# Patient Record
Sex: Male | Born: 2005 | Race: White | Hispanic: Yes | Marital: Single | Smoking: Never smoker
Health system: Southern US, Community
[De-identification: ages and names within clinical notes are randomized; demographics above are authoritative.]

## PROBLEM LIST (undated history)

## (undated) DIAGNOSIS — E785 Hyperlipidemia, unspecified: Secondary | ICD-10-CM

## (undated) HISTORY — DX: Hyperlipidemia, unspecified: E78.5

## (undated) HISTORY — PX: TESTICULAR EXPLORATION: SHX5145

---
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
## 2023-09-09 ENCOUNTER — Ambulatory Visit: Payer: Self-pay | Admitting: Pediatrics

## 2023-09-09 NOTE — Patient Instructions (Incomplete)
  As your medical provider, it is important to me that you continue to receive high-quality primary care services as you transition to adulthood.  Below is a list of adult medicine practices that are currently accepting new patients.  Please reach out to one of these practices to schedule a new patient appointment as soon as possible.    Adult Primary Care Clinics Name [facility]  Address: [place], [place] [address]  Phone: [phone] Hours: Monday - Friday 9 AM -6 PM  Types of insurance accepted:  Marland Kitchen Pharmacist, community . [place] (orange card) . Medicaid . Medicare . Uninsured  Language services:  Marland Kitchen Video and phone interpreters available   Ages 32 and older    . Adult primary care . Onsite pharmacy . Integrated behavioral health . Financial assistance counseling . Walk-in hours for established patients  Financial assistance counseling hours: Tuesdays 2:00PM - 5:00PM  Thursday 8:30AM - 4:30PM  Space is limited, 10 on Tuesday and 20 on Thursday on a first come, first serve basis  Name [place]  Address: [address] [place], [place] [address]  Phone: [phone]  Hours: Monday - Friday 8:30 AM - 5 PM  Types of insurance accepted:  Marland Kitchen Pharmacist, community . Medicaid . Medicare . Uninsured  Language services:  Marland Kitchen Video and phone interpreters available   All ages - newborn to adult   . Primary care for all ages (children and adults) . Integrated behavioral health . Nutritionist . Financial assistance counseling   Name [place] on the ground floor of [facility]  Address: [address] [place],  [place]  [address]  Phone: [phone]  Hours: Monday - Friday 8:15 AM - 5 PM  Types of insurance accepted:  Marland Kitchen Sports coach . Medicaid . Medicare . Uninsured  Language services:  Marland Kitchen Video and phone interpreters available   Ages 106 and older   . Adult primary care . Nutritionist . Certified Diabetes Educator  . Integrated behavioral health . Financial assistance counseling   Name [facility]  Address: [address] [place], [place] [address]  Phone: [phone]  Hours: Monday - Friday 8:30 AM - 5 PM    Types of insurance accepted:  Marland Kitchen Pharmacist, community . Medicaid . Medicare . Uninsured  Language services:  Marland Kitchen Video and phone interpreters available   All ages - newborn to adult   . Primary care for all ages (children and adults) . Integrated behavioral health . Financial assistance counseling

## 2023-09-09 NOTE — Progress Notes (Deleted)
 Adolescent Well Care Visit Jared [name] is a 18 y.o. male who is here for well care.    PCP:  Kentrail Shew, [place], MD  Interpreter used: {IBHSMARTLISTINTERPRETERYESNO:29718::no}   History was provided by the {CHL AMB PERSONS; PED RELATIVES/OTHER W/PATIENT:[phone]}.  Confidentiality was discussed with the patient and, if applicable, with caregiver as well. Patient's personal or confidential phone number: ***  Current Issues:  ***.   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 BP appropriate today. No vision changes, headaches, swelling. *** fasting?***  Hypertriglyceridemia + mixed hyperlipidemia-previously followed by endocrinology, February 2023.  HgbA1c + fasting lipid panel today *** still trying to avoid fatty and fried foods  Congenital small testes  seen by Dr. Kathi at [facility] in Dec 2019 for [facility] orchipexy-- at that time L testicle about 40% smaller than one on the right.  Hormone studies (including testosterone  levels) were normal in Feb 2022 -- so L testicular atrophy likely has not affected his over all endocrinology.  Urology advised that he is completely fertile.    Last seen July 2022   Goiter  Last seen for well care May 2023  Previously with a lot of stress related to graduation, anticipated started, and future unknowns *** had been referred to behavioral health  Snoring - previously trialed on Flonase  (discontinued after one week).  Sleep study scheduled given prolonged duration, nighttime wakening, and obesity with elevated BP.  Sleep study was cancelled because of positive covid test, not rescheduled.  No longer with nighttime wakening, gasping or persistent snoring. 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 {yes***/no:17258} Stressors: {Stressors:30367::No}  Education: School Name and Grade: ***  Problems: {CHL AMB PED PROBLEMS AT SCHOOL:[phone]} Future Plans: ***  Menstruation:   Menstrual History: ***   Dental Patient has a dental home: {yes/no***:64::yes}  Confidential Social History:  yes, Smokes marijuana about two times per month (for about 2 years).  Vaping about two times per week (for about 2 years).  Obtains marijuana from friends.  No alcohol or other recreational drug use. *** Tobacco?  {YES/NO/WILD CARDS:18581} Cannabis? {YES/NO/WILD RJMID:81418} Alcohol? {YES/NO/WILD RJMID:81418}  Sexually Active?  {YES [id number]   Partner preference?  {CHL AMB PARTNER PREFERENCE:[phone]}  Pregnancy Prevention: ***  Screenings: The patient completed the Rapid Assessment for Adolescent Preventive Services screening questionnaire and the following topics were identified as risk factors and discussed: {CHL AMB ASSESSMENT TOPICS:21012045}   PHQ-9, modified for Adolescents  completed and results indicated ***  Physical Exam:  There were no vitals filed for this visit. There were no vitals taken for this visit. Body mass index: body mass index is unknown because there is no height or weight on file. Growth %ile SmartLinks can only be used for patients less than 101 years old.  No results found.  General Appearance:   {PE GENERAL APPEARANCE:22457}  HENT: Normocephalic, no obvious abnormality, conjunctiva clear  Mouth:   Normal appearing teeth,***  untreated dental caries,   Neck:   Supple; thyroid : no enlargement, symmetric, no  tenderness/mass/nodules  Chest ***  Lungs:   Clear to auscultation bilaterally, normal work of breathing  Heart:   Regular rate and rhythm, S1 and S2 normal, no murmurs;   Abdomen:   Soft, non-tender, no mass, or organomegaly  GU {adol gu exam:315266}  Musculoskeletal:   Tone and strength strong and symmetrical, all extremities               Lymphatic:   No cervical adenopathy  Skin/Hair/Nails:   Skin warm, dry and intact, no rashes, no bruises or petechiae  Skin-Acne:  ***  Neurologic:   Strength, gait, and coordination normal and age-appropriate     Assessment and Plan:   *** Growth: {Growth:29841::Appropriate growth for age}  BMI {ACTION; IS/IS WNU:78978602} appropriate for age  Concerns regarding school: {Yes/No:304960894::No}  Concerns regarding home: {Yes/No:304960894::No}  Hearing screening result:{normal/abnormal/not examined:14677} Vision screening result: {normal/abnormal/not examined:14677}  Counseling provided for {CHL AMB PED VACCINE COUNSELING:210130100} vaccine components No orders of the defined types were placed in this encounter.    No follow-ups on file.SABRA  [place] B [name], MD

## 2023-10-13 ENCOUNTER — Encounter (HOSPITAL_COMMUNITY): Payer: Self-pay

## 2023-10-13 ENCOUNTER — Emergency Department (HOSPITAL_COMMUNITY): Payer: Self-pay

## 2023-10-13 ENCOUNTER — Emergency Department (HOSPITAL_COMMUNITY)
Admission: EM | Admit: 2023-10-13 | Discharge: 2023-10-13 | Disposition: A | Discharge status: Home / Self Care | Payer: Self-pay | Source: Ambulatory Visit | Attending: Emergency Medicine | Admitting: Emergency Medicine

## 2023-10-13 ENCOUNTER — Other Ambulatory Visit: Payer: Self-pay

## 2023-10-13 ENCOUNTER — Ambulatory Visit (HOSPITAL_COMMUNITY)
Admission: EM | Admit: 2023-10-13 | Discharge: 2023-10-13 | Disposition: A | Discharge status: Home / Self Care | Payer: Self-pay | Attending: Internal Medicine | Admitting: Internal Medicine

## 2023-10-13 DIAGNOSIS — M545 Low back pain, unspecified: Secondary | ICD-10-CM | POA: Insufficient documentation

## 2023-10-13 DIAGNOSIS — M25552 Pain in left hip: Secondary | ICD-10-CM | POA: Insufficient documentation

## 2023-10-13 DIAGNOSIS — S299XXA Unspecified injury of thorax, initial encounter: Secondary | ICD-10-CM | POA: Insufficient documentation

## 2023-10-13 DIAGNOSIS — S0990XA Unspecified injury of head, initial encounter: Secondary | ICD-10-CM | POA: Insufficient documentation

## 2023-10-13 DIAGNOSIS — S301XXA Contusion of abdominal wall, initial encounter: Secondary | ICD-10-CM | POA: Insufficient documentation

## 2023-10-13 DIAGNOSIS — W19XXXA Unspecified fall, initial encounter: Secondary | ICD-10-CM

## 2023-10-13 DIAGNOSIS — W11XXXA Fall on and from ladder, initial encounter: Secondary | ICD-10-CM | POA: Insufficient documentation

## 2023-10-13 LAB — I-STAT CHEM 8, ED
BUN: 14 mg/dL (ref 6–20)
Calcium, Ion: 1.17 mmol/L (ref 1.15–1.40)
Chloride: 106 mmol/L (ref 98–111)
Creatinine, Ser: 0.8 mg/dL (ref 0.61–1.24)
Glucose, Bld: 117 mg/dL — ABNORMAL HIGH (ref 70–99)
HCT: 44 % (ref 39.0–52.0)
Hemoglobin: 15 g/dL (ref 13.0–17.0)
Potassium: 4.7 mmol/L (ref 3.5–5.1)
Sodium: 139 mmol/L (ref 135–145)
TCO2: 24 mmol/L (ref 22–32)

## 2023-10-13 LAB — CBC
HCT: 45 % (ref 39.0–52.0)
Hemoglobin: 14.6 g/dL (ref 13.0–17.0)
MCH: 29 pg (ref 26.0–34.0)
MCHC: 32.4 g/dL (ref 30.0–36.0)
MCV: 89.5 fL (ref 80.0–100.0)
Platelets: 381 K/uL (ref 150–400)
RBC: 5.03 MIL/uL (ref 4.22–5.81)
RDW: 12.3 % (ref 11.5–15.5)
WBC: 10.4 K/uL (ref 4.0–10.5)
nRBC: 0 % (ref 0.0–0.2)

## 2023-10-13 LAB — BASIC METABOLIC PANEL WITH GFR
Anion gap: 11 (ref 5–15)
BUN: 13 mg/dL (ref 6–20)
CO2: 21 mmol/L — ABNORMAL LOW (ref 22–32)
Calcium: 9.4 mg/dL (ref 8.9–10.3)
Chloride: 104 mmol/L (ref 98–111)
Creatinine, Ser: 0.8 mg/dL (ref 0.61–1.24)
GFR, Estimated: >60 mL/min (ref >60)
Glucose, Bld: 118 mg/dL — ABNORMAL HIGH (ref 70–99)
Potassium: 4.5 mmol/L (ref 3.5–5.1)
Sodium: 136 mmol/L (ref 135–145)

## 2023-10-13 MED ORDER — IOHEXOL 350 MG/ML SOLN
75.0000 mL | Freq: Once | INTRAVENOUS | Status: AC | PRN
  Administered 2023-10-13: 75 mL via INTRAVENOUS

## 2023-10-13 MED ORDER — KETOROLAC TROMETHAMINE 30 MG/ML IJ SOLN
30.0000 mg | Freq: Once | INTRAMUSCULAR | Status: AC
  Administered 2023-10-13: 30 mg via INTRAVENOUS
  Filled 2023-10-13: qty 1

## 2023-10-13 MED ORDER — MORPHINE SULFATE (PF) 2 MG/ML IV SOLN
4.0000 mg | Freq: Once | INTRAVENOUS | Status: AC
  Administered 2023-10-13: 4 mg via INTRAVENOUS
  Filled 2023-10-13: qty 2

## 2023-10-13 MED ORDER — FENTANYL CITRATE PF 50 MCG/ML IJ SOSY: 50.0000 ug | PREFILLED_SYRINGE | Freq: Once | INTRAMUSCULAR | Status: DC

## 2023-10-13 NOTE — ED Triage Notes (Signed)
 Pt states fell 3-48ft off a latter yesterday landing on lt side on the ground. C/o lt hip pain up to back with bruising noted. Took tylenol with no relief. States unable to bare weight.

## 2023-10-13 NOTE — ED Provider Notes (Signed)
 [place] EMERGENCY DEPARTMENT AT [facility] Provider Note   CSN: [id number] Arrival date & time: 10/13/23  1026     Patient presents with: Back Pain   Jared [name] is a 18 y.o. male.   Patient here after fall yesterday.  Pain to the left lower back and left hip after falling back from a ladder from about 4 feet.  He initially had a small amount of pain but worsened here over the last day.  He has noticed some bruising to the left side of his back and flank.  Denies any weakness.  Has not had any loss of bowel or bladder.  Not on any blood thinners.  Not having any extremity pain otherwise.  The history is provided by the patient.       Prior to Admission medications   Not on File    Allergies: Patient has no known allergies.    Review of Systems  Updated Vital Signs BP 130/77 (BP Location: Left Arm)   Pulse 79   Temp 98 F (36.7 C)   Resp 20   Ht 6' 4 (1.93 m)   Wt 104.3 kg   SpO2 98%   BMI 28.00 kg/m   Physical Exam Vitals and nursing note reviewed.  Constitutional:      General: He is not in acute distress.    Appearance: He is well-developed.  HENT:     Head: Normocephalic and atraumatic.  Eyes:     Conjunctiva/sclera: Conjunctivae normal.  Cardiovascular:     Rate and Rhythm: Normal rate and regular rhythm.     Heart sounds: No murmur heard. Pulmonary:     Effort: Pulmonary effort is normal. No respiratory distress.     Breath sounds: Normal breath sounds.  Abdominal:     Palpations: Abdomen is soft.     Tenderness: There is no abdominal tenderness.  Musculoskeletal:        General: No swelling.     Cervical back: Normal range of motion and neck supple. No tenderness.     Comments: No specific midline spinal tenderness, tenderness to the paraspinal lumbar and thoracic muscles bilaterally, tenderness to the left flank/left hip area, ecchymosis to the left flank  Skin:    General: Skin is warm and dry.     Capillary Refill:  Capillary refill takes less than 2 seconds.  Neurological:     General: No focal deficit present.     Mental Status: He is alert and oriented to person, place, and time.     Cranial Nerves: No cranial nerve deficit.     Sensory: No sensory deficit.     Motor: No weakness.     Coordination: Coordination normal.  Psychiatric:        Mood and Affect: Mood normal.     (all labs ordered are listed, but only abnormal results are displayed) Labs Reviewed  BASIC METABOLIC PANEL WITH GFR - Abnormal; Notable for the following components:      Result Value   CO2 21 (*)    Glucose, Bld 118 (*)    All other components within normal limits  I-STAT CHEM 8, ED - Abnormal; Notable for the following components:   Glucose, Bld 117 (*)    All other components within normal limits  CBC    EKG: None  Radiology: CT HEAD WO CONTRAST Result Date: 10/13/2023 CLINICAL DATA:  Provided history: Head trauma, moderate/severe. EXAM: CT HEAD WITHOUT CONTRAST TECHNIQUE: Contiguous axial images were obtained from  the base of the skull through the vertex without intravenous contrast. RADIATION DOSE REDUCTION: This exam was performed according to the departmental dose-optimization program which includes automated exposure control, adjustment of the mA and/or kV according to patient size and/or use of iterative reconstruction technique. COMPARISON:  Head CT 08/29/2008. FINDINGS: Brain: Cerebral volume is normal. There is no acute intracranial hemorrhage. No demarcated cortical infarct. No extra-axial fluid collection. No evidence of an intracranial mass. No midline shift. Vascular: No hyperdense vessel. Skull: No calvarial fracture or aggressive osseous lesion. Sinuses/Orbits: No mass or acute finding within the imaged orbits. No significant paranasal sinus disease. IMPRESSION: No evidence of an acute intracranial abnormality. Electronically Signed   By: Rockey Childs D.O.   On: 10/13/2023 13:55   CT CERVICAL SPINE WO  CONTRAST Result Date: 10/13/2023 CLINICAL DATA:  Fall.  Back pain. EXAM: CT CERVICAL, THORACIC, AND LUMBAR SPINE WITHOUT CONTRAST TECHNIQUE: Multidetector CT imaging of the cervical, thoracic and lumbar spine was performed without intravenous contrast. Multiplanar CT image reconstructions were also generated. RADIATION DOSE REDUCTION: This exam was performed according to the departmental dose-optimization program which includes automated exposure control, adjustment of the mA and/or kV according to patient size and/or use of iterative reconstruction technique. COMPARISON:  None Available. FINDINGS: CT CERVICAL SPINE FINDINGS Alignment: No acute subluxation. Skull base and vertebrae: No acute fracture. Soft tissues and spinal canal: No prevertebral fluid or swelling. No visible canal hematoma. Disc levels:  No acute findings.  No degenerative changes. Upper chest: Negative. CT THORACIC SPINE FINDINGS Alignment: No acute subluxation. Vertebrae: No acute fracture. Paraspinal and other soft tissues: Negative. Disc levels: No acute findings. No significant degenerative changes. CT LUMBAR SPINE FINDINGS Segmentation: 5 lumbar type vertebrae. Alignment: No acute subluxation. Vertebrae: No acute fracture. Paraspinal and other soft tissues: Partially visualized subcutaneous contusion of the left posterior lumbar region and a partially visualized hematoma. See report for the CT of the abdomen pelvis with Disc levels: No acute findings. No significant degenerative changes. IMPRESSION: 1. No acute/traumatic cervical, thoracic, or lumbar spine pathology. 2. Partially visualized subcutaneous contusion and hematoma of the left posterior lumbar region Electronically Signed   By: Vanetta Chou M.D.   On: 10/13/2023 13:40   CT L-SPINE NO CHARGE Result Date: 10/13/2023 CLINICAL DATA:  Fall.  Back pain. EXAM: CT CERVICAL, THORACIC, AND LUMBAR SPINE WITHOUT CONTRAST TECHNIQUE: Multidetector CT imaging of the cervical, thoracic and  lumbar spine was performed without intravenous contrast. Multiplanar CT image reconstructions were also generated. RADIATION DOSE REDUCTION: This exam was performed according to the departmental dose-optimization program which includes automated exposure control, adjustment of the mA and/or kV according to patient size and/or use of iterative reconstruction technique. COMPARISON:  None Available. FINDINGS: CT CERVICAL SPINE FINDINGS Alignment: No acute subluxation. Skull base and vertebrae: No acute fracture. Soft tissues and spinal canal: No prevertebral fluid or swelling. No visible canal hematoma. Disc levels:  No acute findings.  No degenerative changes. Upper chest: Negative. CT THORACIC SPINE FINDINGS Alignment: No acute subluxation. Vertebrae: No acute fracture. Paraspinal and other soft tissues: Negative. Disc levels: No acute findings. No significant degenerative changes. CT LUMBAR SPINE FINDINGS Segmentation: 5 lumbar type vertebrae. Alignment: No acute subluxation. Vertebrae: No acute fracture. Paraspinal and other soft tissues: Partially visualized subcutaneous contusion of the left posterior lumbar region and a partially visualized hematoma. See report for the CT of the abdomen pelvis with Disc levels: No acute findings. No significant degenerative changes. IMPRESSION: 1. No acute/traumatic cervical, thoracic, or lumbar  spine pathology. 2. Partially visualized subcutaneous contusion and hematoma of the left posterior lumbar region Electronically Signed   By: Vanetta Chou M.D.   On: 10/13/2023 13:40   CT T-SPINE NO CHARGE Result Date: 10/13/2023 CLINICAL DATA:  Fall.  Back pain. EXAM: CT CERVICAL, THORACIC, AND LUMBAR SPINE WITHOUT CONTRAST TECHNIQUE: Multidetector CT imaging of the cervical, thoracic and lumbar spine was performed without intravenous contrast. Multiplanar CT image reconstructions were also generated. RADIATION DOSE REDUCTION: This exam was performed according to the departmental  dose-optimization program which includes automated exposure control, adjustment of the mA and/or kV according to patient size and/or use of iterative reconstruction technique. COMPARISON:  None Available. FINDINGS: CT CERVICAL SPINE FINDINGS Alignment: No acute subluxation. Skull base and vertebrae: No acute fracture. Soft tissues and spinal canal: No prevertebral fluid or swelling. No visible canal hematoma. Disc levels:  No acute findings.  No degenerative changes. Upper chest: Negative. CT THORACIC SPINE FINDINGS Alignment: No acute subluxation. Vertebrae: No acute fracture. Paraspinal and other soft tissues: Negative. Disc levels: No acute findings. No significant degenerative changes. CT LUMBAR SPINE FINDINGS Segmentation: 5 lumbar type vertebrae. Alignment: No acute subluxation. Vertebrae: No acute fracture. Paraspinal and other soft tissues: Partially visualized subcutaneous contusion of the left posterior lumbar region and a partially visualized hematoma. See report for the CT of the abdomen pelvis with Disc levels: No acute findings. No significant degenerative changes. IMPRESSION: 1. No acute/traumatic cervical, thoracic, or lumbar spine pathology. 2. Partially visualized subcutaneous contusion and hematoma of the left posterior lumbar region Electronically Signed   By: Vanetta Chou M.D.   On: 10/13/2023 13:40   CT CHEST ABDOMEN PELVIS W CONTRAST Result Date: 10/13/2023 CLINICAL DATA:  Back and abdominal pain after fall yesterday. EXAM: CT CHEST, ABDOMEN, AND PELVIS WITH CONTRAST TECHNIQUE: Multidetector CT imaging of the chest, abdomen and pelvis was performed following the standard protocol during bolus administration of intravenous contrast. RADIATION DOSE REDUCTION: This exam was performed according to the departmental dose-optimization program which includes automated exposure control, adjustment of the mA and/or kV according to patient size and/or use of iterative reconstruction technique.  CONTRAST:  75mL OMNIPAQUE  IOHEXOL  350 MG/ML SOLN COMPARISON:  None Available. FINDINGS: CT CHEST FINDINGS Cardiovascular: No significant vascular findings. Normal heart size. No pericardial effusion. Mediastinum/Nodes: No enlarged mediastinal, hilar, or axillary lymph nodes. Thyroid  gland, trachea, and esophagus demonstrate no significant findings. Lungs/Pleura: Lungs are clear. No pleural effusion or pneumothorax. Musculoskeletal: No chest wall mass or suspicious bone lesions identified. CT ABDOMEN PELVIS FINDINGS Hepatobiliary: No focal liver abnormality is seen. No gallstones, gallbladder wall thickening, or biliary dilatation. Pancreas: Unremarkable. No pancreatic ductal dilatation or surrounding inflammatory changes. Spleen: Normal in size without focal abnormality. Adrenals/Urinary Tract: Adrenal glands are unremarkable. Kidneys are normal, without renal calculi, focal lesion, or hydronephrosis. Bladder is unremarkable. Stomach/Bowel: Stomach is within normal limits. Appendix appears normal. No evidence of bowel wall thickening, distention, or inflammatory changes. Vascular/Lymphatic: No significant vascular findings are present. No enlarged abdominal or pelvic lymph nodes. Reproductive: Prostate is unremarkable. Other: Large hematoma is seen in subcutaneous tissues of left posterior flank region which extends inferiorly to the level of the iliac crest. Musculoskeletal: No acute or significant osseous findings. IMPRESSION: 1. Large hematoma is seen in subcutaneous tissues of left posterior flank region which extends inferiorly to the level of the iliac crest. 2. No other traumatic injury seen in the chest, abdomen or pelvis. Electronically Signed   By: Lynwood Landy Raddle M.D.   On:  10/13/2023 13:39     Procedures   Medications Ordered in the ED  ketorolac  (TORADOL ) 30 MG/ML injection 30 mg (has no administration in time range)  morphine  (PF) 2 MG/ML injection 4 mg (4 mg Intravenous Given 10/13/23 1303)   iohexol  (OMNIPAQUE ) 350 MG/ML injection 75 mL (75 mLs Intravenous Contrast Given 10/13/23 1247)                                    Medical Decision Making Amount and/or Complexity of Data Reviewed Labs: ordered.  Risk Prescription drug management.   Jared [name] is here at left flank pain after he fell backwards off of a ladder yesterday from about 4 feet.  He has hematoma to his left flank.  Has been able to ambulate but with discomfort.  Has had CT imaging ordered while in triage with a CT of his head neck chest abdomen and pelvis and of his back.  He has been given morphine  with great improvement.  Lab work is unremarkable.  I reviewed interpreted labs.  He has got normal vitals.  No fever.  He is not on any anticoagulation.  Looks like he has a soft tissue hematoma on exam.  CT scans were done and read by radiologist with no acute findings other than edema and contusion to the left flank muscles.  Overall suspect contusion of the soft tissues but there is no fracture of the spine hip or any other processes going on.  Gave him a dose of Toradol  here in the ED.  Recommend Tylenol ibuprofen ice and rest and suspect that he will heal well.  Understands return precautions.  Patient discharged.  This chart was dictated using voice recognition software.  Despite best efforts to proofread,  errors can occur which can change the documentation meaning.      Final diagnoses:  Hematoma of flank    ED Discharge Orders     None          Ruthe Cornet, DO 10/13/23 1416

## 2023-10-13 NOTE — ED Provider Notes (Signed)
 [facility]    CSN: [id number] Arrival date & time: 10/13/23  9156      History   Chief Complaint Chief Complaint  Patient presents with   Fall   HPI Mychael Smock is a 18 y.o. male presenting to urgent care endorsing left hip and lower back pain.  He fell off a ladder, roughly 3-4 feet, landing on his left buttock yesterday.  He reports acute onset of pain in the left lateral hip and lower back.  He was able to walk initially, went home and took a shower, but says that his pain gradually worsened overnight.  Today he is unable to bear weight because of pain.  He tried taking Tylenol for pain relief, which was mildly effective.  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   Essential hypertension, benign 09/22/2017   Acanthosis 09/22/2017   Dyspepsia 09/22/2017   Goiter 09/22/2017    Past Surgical History:  Procedure Laterality Date   TESTICULAR EXPLORATION       Home Medications    Prior to Admission medications   Not on File    Family History Family History  Problem Relation Age of Onset   Hyperlipidemia Mother    Hyperlipidemia Maternal Grandfather    Diabetes Maternal Grandfather    Hyperlipidemia Paternal Grandfather    Diabetes Paternal Grandfather     Social History Social History   Tobacco Use   Smoking status: Never   Smokeless tobacco: Never  Vaping Use   Vaping status: Never Used  Substance Use Topics   Alcohol use: No   Drug use: No   Allergies   Patient has no known allergies.   Review of Systems Review of Systems  Musculoskeletal:  Positive for arthralgias (Left hip), back pain (Left lumbar) and gait problem (Unable to bear weight on the left lower  extremity).   Physical Exam Triage Vital Signs ED Triage Vitals [10/13/23 0954]  Encounter Vitals Group     BP 132/76     Girls Systolic BP Percentile      Girls Diastolic BP Percentile      Boys Systolic BP Percentile      Boys Diastolic BP Percentile      Pulse Rate 78     Resp 18     Temp 97.9 F (36.6 C)     Temp Source Oral     SpO2 97 %     Weight      Height      Head Circumference      Peak Flow      Pain Score 10     Pain Loc      Pain Education      Exclude from Growth Chart    No data found.  Updated Vital Signs BP 132/76 (BP Location: Right Arm)   Pulse 78   Temp 97.9 F (36.6 C) (Oral)   Resp 18   SpO2 97%   Physical Exam Vitals reviewed.  Constitutional:      Comments: Examined in wheelchair  HENT:     Head: Normocephalic and atraumatic.  Cardiovascular:     Rate and Rhythm: Normal rate and regular rhythm.  Abdominal:     General: Abdomen is flat.  There is no distension.     Palpations: Abdomen is soft. There is mass (There is a palpable, firm, tender mass in the left flank and lumbar region).     Tenderness: There is no abdominal tenderness.  Musculoskeletal:        General: Swelling and deformity present.     Comments: Large, firm mass in the left flank and lumbar region with overlying ecchymoses.  Limited range of motion at the left hip secondary to pain.  Patient is able to stand with assistance but cannot ambulate due to pain.  Skin:    General: Skin is warm and dry.     Capillary Refill: Capillary refill takes less than 2 seconds.     Findings: Bruising (Dark discolored ecchymoses on left flank and lumbar region) present.  Neurological:     General: No focal deficit present.     Mental Status: He is alert and oriented to person, place, and time.    UC Treatments / Results  Labs (all labs ordered are listed, but only abnormal results are displayed) Labs Reviewed - No data to display  EKG   Radiology No results  found.  Procedures Procedures (including critical care time)  Medications Ordered in UC Medications - No data to display  Initial Impression / Assessment and Plan / UC Course  I have reviewed the triage vital signs and the nursing notes.  Pertinent labs & imaging results that were available during my care of the patient were reviewed by me and considered in my medical decision making (see chart for details).    Patient is a 18 year old male presenting to urgent care after a fall at work yesterday.  He currently endorses pain in the left lateral hip and lumbar spine.  He cannot bear weight because of pain.  On exam there is a large, firm mass in the left flank and lumbar region, concerning for hematoma.  Fortunately he is hemodynamically stable, but I believe he needs advanced imaging in the setting of recent trauma and presenting symptoms particularly because his pain has progressed to the point that he cannot bear weight. With this in mind, I recommended that he present to the emergency department for further evaluation.  I offered to call [facility] for transportation given his inability to bear weight.  Patient declines and says his mother can transport him in a wheelchair.  Patient's mother is agreeable to this.  They both expressed understanding of recommendations for ED presentation.  Final Clinical Impressions(s) / UC Diagnoses   Final diagnoses:  Fall, initial encounter     Discharge Instructions      I recommend that you go to the [facility] Emergency Department next door for imaging of your pelvis and lower back.     ED Prescriptions   None    PDMP not reviewed this encounter.   Melvenia Manus BRAVO, MD 10/13/23 1024

## 2023-10-13 NOTE — Discharge Instructions (Signed)
 Recommend ice 20 minutes on every hour or so.  Recommend 1000 mg of Tylenol every 6 hours as needed for pain.  Recommend 800 mg ibuprofen every 8 hours as needed for pain for the next several days.  Be easy with activity for the next few days.  Increase activity as pain becomes more manageable.

## 2023-10-13 NOTE — ED Triage Notes (Addendum)
 Pt sent by [facility] for imaging r/t to 9/10 left back and hip pain sometimes radiating to left knee d/t 51ft mechanical fall yesterday. Also states he has a big bruise to left buttock and lower back.  Pt needs assistance with ambulation.

## 2023-10-13 NOTE — Discharge Instructions (Signed)
 I recommend that you go to the [facility] Emergency Department next door for imaging of your pelvis and lower back.

## 2023-10-13 NOTE — ED Notes (Signed)
 Patient transported to CT

## 2023-10-13 NOTE — ED Notes (Signed)
 CCMD contacted to place the patient on cardiac monitoring services.

## 2023-10-13 NOTE — ED Notes (Addendum)
 Did not draw Extra Blue & DG drawn, commented under wrong Pt

## 2023-10-13 NOTE — ED Notes (Signed)
 Patient is being discharged from the Urgent Care and sent to the Emergency Department via POV . Per Dr. Melvenia, patient is in need of higher level of care due to need of further evaluation. Patient is aware and verbalizes understanding of plan of care.  Vitals:   10/13/23 0954  BP: 132/76  Pulse: 78  Resp: 18  Temp: 97.9 F (36.6 C)  SpO2: 97%

## 2023-10-13 NOTE — ED Provider Triage Note (Signed)
 Emergency Medicine Provider Triage Evaluation Note  Jared [name] , a 18 y.o. male  was evaluated in triage.  Pt complains of pain to the left lower back as well as to the left hip after falling from a ladder approximately 4 feet yesterday.  Initially had small amount of pain however it is worsened progressively over the last 24 hours in the has progressed to where he has a large amount of swelling and bruising in his left flank as well as to the left hip.  He denies any focal weakness though he does state that there is some slight numbness in the distal left lower extremity that has progressed over the last 24 hours.  No reports of bowel or bladder incontinence..  Review of Systems  Positive: As above Negative:   Physical Exam  BP 130/77 (BP Location: Left Arm)   Pulse 79   Temp 98 F (36.7 C)   Resp 20   Ht 6' 4 (1.93 m)   Wt 104.3 kg   SpO2 98%   BMI 28.00 kg/m  Gen:   Awake, no distress   Resp:  Normal effort  MSK:   Moves extremities without difficulty, though he does have limited external rotation of the left hip with active motion, with passive range of motion is able to externally rotate the hip and without crepitus. Other:  Noted ecchymosis as well as edema to the left flank, area is exquisitely tender.  There is midline spinal tenderness distal to the thoracolumbar junction.  Medical Decision Making  Medically screening exam initiated at 12:29 PM.  Appropriate orders placed.  Jared [name] was informed that the remainder of the evaluation will be completed by another provider, this initial triage assessment does not replace that evaluation, and the importance of remaining in the ED until their evaluation is complete.  Lab evaluation placed as well as with pain control with morphine .  Imaging for possible injuries including CT of the C-spine and head, CT abdomen pelvis with reconstitution of the L-spine and T-spine.  X-ray of the chest and pelvis as well.    Jared [name], [place] 10/13/23 1240

## 2023-10-13 NOTE — ED Notes (Signed)
 CCMD called.
